# Patient Record
Sex: Female | Born: 1950 | Race: Black or African American | Hispanic: No | Smoking: Never smoker
Health system: Southern US, Community
[De-identification: ages and names within clinical notes are randomized; demographics above are authoritative.]

## PROBLEM LIST (undated history)

## (undated) DIAGNOSIS — IMO0002 Reserved for concepts with insufficient information to code with codable children: Secondary | ICD-10-CM

## (undated) DIAGNOSIS — I499 Cardiac arrhythmia, unspecified: Secondary | ICD-10-CM

---

## 2011-03-07 ENCOUNTER — Emergency Department (HOSPITAL_COMMUNITY): Payer: Self-pay

## 2011-03-07 ENCOUNTER — Emergency Department (HOSPITAL_COMMUNITY)
Admission: EM | Admit: 2011-03-07 | Discharge: 2011-03-07 | Disposition: A | Discharge status: Home / Self Care | Payer: Self-pay | Attending: Emergency Medicine | Admitting: Emergency Medicine

## 2011-03-07 ENCOUNTER — Encounter: Payer: Self-pay | Admitting: Nurse Practitioner

## 2011-03-07 DIAGNOSIS — G479 Sleep disorder, unspecified: Secondary | ICD-10-CM | POA: Insufficient documentation

## 2011-03-07 DIAGNOSIS — R51 Headache: Secondary | ICD-10-CM | POA: Insufficient documentation

## 2011-03-07 HISTORY — DX: Reserved for concepts with insufficient information to code with codable children: IMO0002

## 2011-03-07 HISTORY — DX: Cardiac arrhythmia, unspecified: I49.9

## 2011-03-07 MED ORDER — ZOLPIDEM TARTRATE 5 MG PO TABS: 5.0000 mg | ORAL_TABLET | Freq: Every evening | ORAL | Status: DC | PRN

## 2011-03-07 NOTE — ED Provider Notes (Signed)
History     CSN: [id number] Arrival date & time: 03/07/2011  3:58 PM   First MD Initiated Contact with Patient 03/07/11 1633      Chief Complaint  Patient presents with  . Headache    (Consider location/radiation/quality/duration/timing/severity/associated sxs/prior treatment) Patient is a 60 y.o. female presenting with headaches. The history is provided by the patient and a relative. The history is limited by a language barrier. A language interpreter was used (patient's daughter).  Headache  This is a chronic problem. The current episode started more than 1 week ago. Episode frequency: daily. The problem has been gradually worsening. The headache is associated with nothing (occur at night). The pain is located in the frontal region. The pain is at a severity of 8/10. The patient is experiencing no pain. The pain does not radiate. Pertinent negatives include no fever, no malaise/fatigue, no near-syncope, no palpitations, no shortness of breath, no nausea and no vomiting. She has tried nothing for the symptoms. The treatment provided no relief.    Past Medical History  Diagnosis Date  . Ulcer   . Irregular heartbeat     History reviewed. No pertinent past surgical history.  History reviewed. No pertinent family history.  History  Substance Use Topics  . Smoking status: Never Smoker   . Smokeless tobacco: Not on file  . Alcohol Use: No    OB History    Grav Para Term Preterm Abortions TAB SAB Ect Mult Living                  Review of Systems  Constitutional: Negative for fever, chills and malaise/fatigue.  HENT: Negative for neck pain and neck stiffness.   Eyes: Negative for visual disturbance.  Respiratory: Negative for cough and shortness of breath.   Cardiovascular: Negative for chest pain, palpitations and near-syncope.  Gastrointestinal: Negative for nausea, vomiting and abdominal pain.  Musculoskeletal: Negative for back pain.  Skin: Negative for color change  and rash.  Neurological: Negative for dizziness, speech difficulty, weakness, light-headedness, numbness and headaches.  Psychiatric/Behavioral: Positive for sleep disturbance (decreased sleep). Negative for confusion and decreased concentration.  All other systems reviewed and are negative.    Allergies  Aspirin  Home Medications   Current Outpatient Rx  Name Route Sig Dispense Refill  . ADVIL PM PO Oral Take 1 capsule by mouth at bedtime as needed. For sleep       BP 149/84  Pulse 96  Temp(Src) 98.4 F (36.9 C) (Oral)  Resp 16  Ht 5\' 7"  (1.702 m)  Wt 170 lb (77.111 kg)  BMI 26.63 kg/m2  SpO2 98%  Physical Exam  Nursing note and vitals reviewed. Constitutional: She is oriented to person, place, and time. She appears well-developed and well-nourished.  HENT:  Head: Normocephalic and atraumatic.  Eyes: Pupils are equal, round, and reactive to light.  Neck: Normal range of motion. Neck supple.  Cardiovascular: Normal rate, regular rhythm, normal heart sounds and intact distal pulses.   Pulmonary/Chest: Effort normal and breath sounds normal. No respiratory distress.  Abdominal: Soft. She exhibits no distension. There is no tenderness.  Neurological: She is alert and oriented to person, place, and time. No cranial nerve deficit.       Strength and sensation intact  Skin: Skin is warm and dry.  Psychiatric: She has a normal mood and affect.    ED Course  Procedures (including critical care time)  Labs Reviewed - No data to display Ct Head Wo Contrast  03/07/2011  *RADIOLOGY REPORT*  Clinical Data: Headache.  Worse at night.  CT HEAD WITHOUT CONTRAST  Technique:  Contiguous axial images were obtained from the base of the skull through the vertex without contrast.  Comparison: None.  Findings: Bone windows demonstrate hypoplastic frontal sinuses. Hypoaerated left mastoid air cells, possibly developmental.  Soft tissue windows demonstrate no  mass lesion, hemorrhage,  hydrocephalus, acute infarct, intra-axial, or extra-axial fluid collection.  IMPRESSION: No acute intracranial abnormality.  Original Report Authenticated By: Consuello Bossier, M.D.     1. Headache       MDM  60 yo F presents with intermittent HA at night, bad for the past week or so. States she has a hx of chronic HA, which are typically frontal and R sided, and happen spontaneously; recently arrived from [place] ~1 mo ago for prolonged visit with family, and since then has had gradual worsening of HA. No associated symptoms, present at night, not in morning, no neuro complaints associated with headache. Has never had formal workup for headaches (treated by healer with herbal remedy). Denies other complaints. Exam unremarkable- no signs of neuro findings on exam; pt currently without headache. Due to increasing intensity of headaches, worse at night, and no ability to follow up as outpatient, will get head CT to evaluate for possible underlying structural etiology of headache.  CT head unremarkable, no findings to contribute to headaches. Discussed with pt and family treatment of chronic headaches, as well as sleep hygiene, indications for return, and pt and family express understanding; will prescribe medication to help with sleep as needed.      Theotis Burrow, MD 03/07/11 [phone]

## 2011-03-07 NOTE — ED Notes (Signed)
Patient transported to CT, family remains at the bedside to help with translation should it be needed.

## 2011-03-07 NOTE — ED Notes (Signed)
C/o headache, weakness, not sleeping well or feeling well over past week. Reports yesterday her BP was high also.

## 2011-03-07 NOTE — ED Provider Notes (Signed)
I saw and evaluated the patient, reviewed the resident's note and I agree with the findings and plan.  Headache intermittent, none currently. Having difficulty sleeping, was taking sleeping pills unk type in [place] and none here. Recent weight loss. Neuro exam unremarkable. CT head negative. HOme with supportive care. Ambien for sleep.  Forbes Cellar, MD 03/07/11 2049

## 2011-12-08 ENCOUNTER — Emergency Department (HOSPITAL_COMMUNITY)
Admission: EM | Admit: 2011-12-08 | Discharge: 2011-12-08 | Disposition: A | Discharge status: Home / Self Care | Payer: Self-pay | Attending: Emergency Medicine | Admitting: Emergency Medicine

## 2011-12-08 ENCOUNTER — Encounter (HOSPITAL_COMMUNITY): Payer: Self-pay | Admitting: Emergency Medicine

## 2011-12-08 ENCOUNTER — Emergency Department (HOSPITAL_COMMUNITY): Payer: Self-pay

## 2011-12-08 DIAGNOSIS — H7290 Unspecified perforation of tympanic membrane, unspecified ear: Secondary | ICD-10-CM | POA: Insufficient documentation

## 2011-12-08 DIAGNOSIS — Z888 Allergy status to other drugs, medicaments and biological substances status: Secondary | ICD-10-CM | POA: Insufficient documentation

## 2011-12-08 DIAGNOSIS — H60399 Other infective otitis externa, unspecified ear: Secondary | ICD-10-CM | POA: Insufficient documentation

## 2011-12-08 DIAGNOSIS — H6092 Unspecified otitis externa, left ear: Secondary | ICD-10-CM

## 2011-12-08 DIAGNOSIS — K219 Gastro-esophageal reflux disease without esophagitis: Secondary | ICD-10-CM | POA: Insufficient documentation

## 2011-12-08 LAB — BASIC METABOLIC PANEL
BUN: 11 mg/dL (ref 6–23)
CO2: 25 mEq/L (ref 19–32)
Calcium: 9.6 mg/dL (ref 8.4–10.5)
Chloride: 100 mEq/L (ref 96–112)
Creatinine, Ser: 0.69 mg/dL (ref 0.50–1.10)

## 2011-12-08 LAB — CBC
HCT: 38.1 % (ref 36.0–46.0)
MCH: 30.2 pg (ref 26.0–34.0)
MCV: 89.9 fL (ref 78.0–100.0)
RBC: 4.24 MIL/uL (ref 3.87–5.11)
WBC: 6.1 10*3/uL (ref 4.0–10.5)

## 2011-12-08 MED ORDER — OMEPRAZOLE 20 MG PO CPDR: 20.0000 mg | DELAYED_RELEASE_CAPSULE | Freq: Every day | ORAL | Status: DC

## 2011-12-08 MED ORDER — OFLOXACIN 0.3 % OT SOLN: 10.0000 [drp] | Freq: Every day | OTIC | Status: DC

## 2011-12-08 MED ORDER — OFLOXACIN 0.3 % OT SOLN: 10.0000 [drp] | Freq: Two times a day (BID) | OTIC | Status: AC

## 2011-12-08 MED ORDER — OMEPRAZOLE 20 MG PO CPDR: 20.0000 mg | DELAYED_RELEASE_CAPSULE | Freq: Every day | ORAL | Status: AC

## 2011-12-08 NOTE — ED Notes (Signed)
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

## 2011-12-08 NOTE — ED Notes (Signed)
Pt c/o generalized CP that she sts feels like her ulcers x months with right ear pain with fever at times

## 2011-12-08 NOTE — ED Provider Notes (Signed)
Medical screening examination/treatment/procedure(s) were performed by non-physician practitioner and as supervising physician I was immediately available for consultation/collaboration.   Charles B. Bernette Mayers, MD 12/08/11 2053

## 2011-12-08 NOTE — ED Provider Notes (Signed)
History     CSN: [id number]  Arrival date & time 12/08/11  1118   First MD Initiated Contact with Patient 12/08/11 1647      Chief Complaint  Patient presents with  . Chest Pain  . Otalgia  . Fever    (Consider location/radiation/quality/duration/timing/severity/associated sxs/prior treatment) HPI Comments: Patient presents with chest pain, ear pain, and fever. She reports the chest pain as starting years ago when she was diagnosed with acid reflux. She was well controlled on antacid medications when she was living in [place]. She describes the pain as a burning sensation and does not radiate. The pain is associated with eating and feels just like her acid reflux, according to the patient. She denies SOB, dizziness, diaphoresis.   She also complains of left ear pain that started "years ago." The onset was gradual and she reports associated hearing loss in her left ear. She has not taken anything for the pain. The pain feels like pressure and moderate in severity. She has an associated subjective fever. She denies headache, visual changes, ear discharge or ear trauma.   Patient is a 61 y.o. female presenting with chest pain, ear pain, and fever.  Chest Pain Primary symptoms include a fever. Pertinent negatives for primary symptoms include no fatigue, no shortness of breath, no wheezing, no abdominal pain, no nausea, no vomiting and no dizziness.  Pertinent negatives for associated symptoms include no diaphoresis, no numbness and no weakness.    Otalgia Associated symptoms include hearing loss. Pertinent negatives include no ear discharge, no headaches, no abdominal pain, no diarrhea, no vomiting and no rash.  Fever Primary symptoms of the febrile illness include fever. Primary symptoms do not include fatigue, headaches, wheezing, shortness of breath, abdominal pain, nausea, vomiting, diarrhea or rash.    Past Medical History  Diagnosis Date  . Ulcer   . Irregular heartbeat      History reviewed. No pertinent past surgical history.  History reviewed. No pertinent family history.  History  Substance Use Topics  . Smoking status: Never Smoker   . Smokeless tobacco: Not on file  . Alcohol Use: No    OB History    Grav Para Term Preterm Abortions TAB SAB Ect Mult Living                  Review of Systems  Constitutional: Positive for fever. Negative for chills, diaphoresis and fatigue.  HENT: Positive for hearing loss and ear pain. Negative for ear discharge.   Eyes: Negative for photophobia and visual disturbance.  Respiratory: Negative for shortness of breath and wheezing.   Cardiovascular: Positive for chest pain.  Gastrointestinal: Negative for nausea, vomiting, abdominal pain and diarrhea.  Musculoskeletal: Negative for back pain.  Skin: Negative for rash and wound.  Neurological: Negative for dizziness, syncope, weakness, light-headedness, numbness and headaches.    Allergies  Aspirin  Home Medications   Current Outpatient Rx  Name Route Sig Dispense Refill  . ADULT MULTIVITAMIN W/MINERALS CH Oral Take 1 tablet by mouth daily.    . OFLOXACIN 0.3 % OT SOLN Otic Place 10 drops in ear(s) 2 (two) times daily. 5 mL 0  . OMEPRAZOLE 20 MG PO CPDR Oral Take 1 capsule (20 mg total) by mouth daily. 30 capsule 0    BP 143/86  Pulse 89  Temp 98.6 F (37 C) (Oral)  Resp 18  SpO2 98%  Physical Exam  Nursing note and vitals reviewed. Constitutional: She is oriented to person, place, and time.  She appears well-developed and well-nourished. No distress.  HENT:  Head: Normocephalic and atraumatic.  Right Ear: External ear normal.  Left Ear: External ear normal.  Mouth/Throat: Oropharynx is clear and moist. No oropharyngeal exudate.       Left external canal appears erythematous and with small amount of purulent discharge that is crusted. Left TM reveals small perforation in the center. Right TM intact and external canal clear and without  erythema or purulence.   Eyes: Conjunctivae are normal. No scleral icterus.  Neck: Normal range of motion. Neck supple.  Cardiovascular: Normal rate and regular rhythm.  Exam reveals no gallop and no friction rub.   Murmur heard. Pulmonary/Chest: She has no wheezes. She has no rales. She exhibits no tenderness.  Abdominal: Soft. There is no tenderness.  Musculoskeletal: Normal range of motion.  Neurological: She is alert and oriented to person, place, and time.  Skin: Skin is warm and dry. She is not diaphoretic.  Psychiatric: She has a normal mood and affect. Her behavior is normal.    ED Course  Procedures (including critical care time)  Labs Reviewed  BASIC METABOLIC PANEL - Abnormal; Notable for the following:    Glucose, Bld 237 (*)     All other components within normal limits  CBC  POCT I-STAT TROPONIN I   Dg Chest 2 View  12/08/2011  *RADIOLOGY REPORT*  Clinical Data: Chest pain.  Fever.  CHEST - 2 VIEW  Comparison: No priors.  Findings: Lung volumes are normal.  There is an ill-defined interstitial prominence throughout the mid and lower lungs bilaterally.  No focal consolidative airspace disease.  No pleural effusions.  Pulmonary vasculature is normal.  Heart size is upper limits of normal.  Mediastinal contours are unremarkable.  IMPRESSION: 1.  Ill-defined interstitial prominence throughout the mid and lower lungs bilaterally.  This is nonspecific, and could be chronic (no priors are available for comparison).  However, in the setting of acute chest pain and fever, findings may simply reflect a bronchitis.  The comparison with any prior outside chest x-rays (if available) would be of use to determine the chronicity of these findings.   Original Report Authenticated By: Florencia Reasons, M.D.      1. Acid reflux   2. Otitis externa of left ear   3. Tympanic membrane perforation       MDM  5:22 PM Patient's symptoms most likely due to otitis externa with tympanic  membrane perforation. Also she report chest pain that is unchanged and feels just like her acid reflux that she has experienced for years. Her EKG is unremarkable and she has a negative troponin, making a cardiac etiology unlikely. Patient will be discharged with a recommended ENT follow up with Dr. Annalee Genta, prilosec, and ofloxacin otic drops. No further evaluation at this time. I have attached a list of low cost medical providers to be used as follow up.         Emilia Beck, [place] 12/08/11 1741

## 2011-12-11 ENCOUNTER — Other Ambulatory Visit: Payer: Self-pay | Admitting: Otolaryngology

## 2011-12-11 DIAGNOSIS — H905 Unspecified sensorineural hearing loss: Secondary | ICD-10-CM

## 2011-12-11 DIAGNOSIS — H9209 Otalgia, unspecified ear: Secondary | ICD-10-CM

## 2011-12-11 DIAGNOSIS — H903 Sensorineural hearing loss, bilateral: Secondary | ICD-10-CM

## 2011-12-11 DIAGNOSIS — H9190 Unspecified hearing loss, unspecified ear: Secondary | ICD-10-CM

## 2015-07-18 ENCOUNTER — Emergency Department (HOSPITAL_COMMUNITY): Payer: Self-pay

## 2015-07-18 ENCOUNTER — Emergency Department (HOSPITAL_COMMUNITY)
Admission: EM | Admit: 2015-07-18 | Discharge: 2015-07-18 | Disposition: A | Discharge status: Home / Self Care | Payer: Self-pay | Attending: Emergency Medicine | Admitting: Emergency Medicine

## 2015-07-18 ENCOUNTER — Encounter (HOSPITAL_COMMUNITY): Payer: Self-pay | Admitting: *Deleted

## 2015-07-18 DIAGNOSIS — R000 Tachycardia, unspecified: Secondary | ICD-10-CM | POA: Insufficient documentation

## 2015-07-18 DIAGNOSIS — H9201 Otalgia, right ear: Secondary | ICD-10-CM | POA: Insufficient documentation

## 2015-07-18 DIAGNOSIS — J189 Pneumonia, unspecified organism: Secondary | ICD-10-CM

## 2015-07-18 DIAGNOSIS — Z8679 Personal history of other diseases of the circulatory system: Secondary | ICD-10-CM | POA: Insufficient documentation

## 2015-07-18 DIAGNOSIS — Z79899 Other long term (current) drug therapy: Secondary | ICD-10-CM | POA: Insufficient documentation

## 2015-07-18 DIAGNOSIS — J159 Unspecified bacterial pneumonia: Secondary | ICD-10-CM | POA: Insufficient documentation

## 2015-07-18 DIAGNOSIS — D649 Anemia, unspecified: Secondary | ICD-10-CM | POA: Insufficient documentation

## 2015-07-18 LAB — BASIC METABOLIC PANEL
ANION GAP: 10 (ref 5–15)
BUN: 7 mg/dL (ref 6–20)
CALCIUM: 9.2 mg/dL (ref 8.9–10.3)
CHLORIDE: 101 mmol/L (ref 101–111)
CO2: 25 mmol/L (ref 22–32)
CREATININE: 0.83 mg/dL (ref 0.44–1.00)
GFR calc non Af Amer: >60 mL/min (ref >60)
Glucose, Bld: 133 mg/dL — ABNORMAL HIGH (ref 65–99)
POTASSIUM: 3.7 mmol/L (ref 3.5–5.1)
SODIUM: 136 mmol/L (ref 135–145)

## 2015-07-18 LAB — CBC WITH DIFFERENTIAL/PLATELET
BASOS ABS: 0 10*3/uL (ref 0.0–0.1)
BASOS PCT: 0 %
EOS ABS: 0 10*3/uL (ref 0.0–0.7)
Eosinophils Relative: 0 %
HCT: 32.9 % — ABNORMAL LOW (ref 36.0–46.0)
HEMOGLOBIN: 11.1 g/dL — AB (ref 12.0–15.0)
LYMPHS ABS: 2.6 10*3/uL (ref 0.7–4.0)
Lymphocytes Relative: 18 %
MCH: 31.1 pg (ref 26.0–34.0)
MCHC: 33.7 g/dL (ref 30.0–36.0)
MCV: 92.2 fL (ref 78.0–100.0)
Monocytes Absolute: 1 10*3/uL (ref 0.1–1.0)
Monocytes Relative: 7 %
NEUTROS PCT: 75 %
Neutro Abs: 10.6 10*3/uL — ABNORMAL HIGH (ref 1.7–7.7)
PLATELETS: 374 10*3/uL (ref 150–400)
RBC: 3.57 MIL/uL — AB (ref 3.87–5.11)
RDW: 12.8 % (ref 11.5–15.5)
WBC: 14.3 10*3/uL — AB (ref 4.0–10.5)

## 2015-07-18 LAB — I-STAT TROPONIN, ED: TROPONIN I, POC: 0 ng/mL (ref 0.00–0.08)

## 2015-07-18 MED ORDER — KETOROLAC TROMETHAMINE 30 MG/ML IJ SOLN
30.0000 mg | Freq: Once | INTRAMUSCULAR | Status: AC
  Administered 2015-07-18: 30 mg via INTRAVENOUS
  Filled 2015-07-18: qty 1

## 2015-07-18 MED ORDER — AZITHROMYCIN 250 MG PO TABS
500.0000 mg | ORAL_TABLET | Freq: Once | ORAL | Status: AC
  Administered 2015-07-18: 500 mg via ORAL
  Filled 2015-07-18: qty 2

## 2015-07-18 MED ORDER — SODIUM CHLORIDE 0.9 % IV BOLUS (SEPSIS)
1000.0000 mL | Freq: Once | INTRAVENOUS | Status: AC
  Administered 2015-07-18: 1000 mL via INTRAVENOUS

## 2015-07-18 MED ORDER — AZITHROMYCIN 250 MG PO TABS: 250.0000 mg | ORAL_TABLET | Freq: Every day | ORAL | Status: AC

## 2015-07-18 MED ORDER — DIPHENHYDRAMINE HCL 50 MG/ML IJ SOLN
25.0000 mg | Freq: Once | INTRAMUSCULAR | Status: AC
  Administered 2015-07-18: 25 mg via INTRAVENOUS
  Filled 2015-07-18: qty 1

## 2015-07-18 NOTE — ED Provider Notes (Signed)
CSN: [id number]     Arrival date & time 07/18/15  6213 History   First MD Initiated Contact with Patient 07/18/15 1008     Chief Complaint  Patient presents with  . Cough  . Otalgia   (Consider location/radiation/quality/duration/timing/severity/associated sxs/prior Treatment) Patient is a 65 y.o. female presenting with cough and ear pain. The history is provided by the patient and a relative. No language interpreter was used (Son translated).  Cough Associated symptoms: chills and ear pain   Associated symptoms: no fever and no shortness of breath   Otalgia Associated symptoms: cough   Associated symptoms: no fever     Monica Chaney is a 65 y.o female with a history of irregular heartbeat who presents with right ear pain and productive white sputum cough, headache, and chills at night. Also complaining of chest pain with cough and nasal congestion. Her grandkids have recently been sick with similar symptoms. She denies any treatment prior to arrival. She denies any history of smoking or asthma. Her son reports she has had ear pain for several years now. This is not new for her. She denies any weight loss, night sweats, shortness of breath, abdominal pain, nausea, vomiting, diarrhea.   Past Medical History  Diagnosis Date  . Ulcer   . Irregular heartbeat    History reviewed. No pertinent past surgical history. No family history on file. Social History  Substance Use Topics  . Smoking status: Never Smoker   . Smokeless tobacco: None  . Alcohol Use: No   OB History    No data available     Review of Systems  Constitutional: Positive for chills. Negative for fever.  HENT: Positive for ear pain.   Respiratory: Positive for cough. Negative for shortness of breath.   All other systems reviewed and are negative.     Allergies  Aspirin  Home Medications   Prior to Admission medications   Medication Sig Start Date End Date Taking? Authorizing Provider  acetaminophen  (TYLENOL) 325 MG tablet Take 650 mg by mouth every 6 (six) hours as needed for mild pain.   Yes Historical Provider, MD  Phenylephrine-Pheniramine-DM [facility] COLD & COUGH) 01-31-19 MG PACK Take 1 packet by mouth every 8 (eight) hours as needed (cold symptoms).   Yes Historical Provider, MD  azithromycin (ZITHROMAX) 250 MG tablet Take 1 tablet (250 mg total) by mouth daily. Take 1 every day beginning on 07/19/2015 until finished. You received her first dose in the ER today. 07/18/15   Brylin Stopper Patel-Mills, PA-C  omeprazole (PRILOSEC) 20 MG capsule Take 1 capsule (20 mg total) by mouth daily. 12/08/11 12/07/12  Kaitlyn Szekalski, PA-C   BP 139/81 mmHg  Pulse 89  Temp(Src) 99.5 F (37.5 C) (Oral)  Resp 20  Ht 5' 5.5" (1.664 m)  Wt 75.07 kg  BMI 27.11 kg/m2  SpO2 97% Physical Exam  Constitutional: She is oriented to person, place, and time. She appears well-developed and well-nourished. No distress.  HENT:  Head: Normocephalic and atraumatic.  Bilateral TMs are without erythema or bulging. No air-fluid level. Ear canals appear normal also.  Eyes: Conjunctivae are normal.  Neck: Normal range of motion. Neck supple.  Cardiovascular: Normal rate, regular rhythm and normal heart sounds.   Regular rate and rhythm. No murmur. Distal pulses intact.   Pulmonary/Chest: Effort normal. No accessory muscle usage. No respiratory distress. She has no wheezes. She has rhonchi in the left lower field.  No respiratory distress. Able to speak full sentences. No accessory muscle  usage. 93% oxygen on room air at bedside.  Abdominal: Soft. There is no tenderness.  Abdomen is soft.  Musculoskeletal: Normal range of motion.  Neurological: She is alert and oriented to person, place, and time.  Skin: Skin is warm and dry. She is not diaphoretic.  Nursing note and vitals reviewed.   ED Course  Procedures (including critical care time) Labs Review Labs Reviewed  CBC WITH DIFFERENTIAL/PLATELET - Abnormal; Notable  for the following:    WBC 14.3 (*)    RBC 3.57 (*)    Hemoglobin 11.1 (*)    HCT 32.9 (*)    Neutro Abs 10.6 (*)    All other components within normal limits  BASIC METABOLIC PANEL - Abnormal; Notable for the following:    Glucose, Bld 133 (*)    All other components within normal limits  I-STAT TROPOININ, ED    Imaging Review Dg Chest 2 View  07/18/2015  CLINICAL DATA:  Productive cough for 2 weeks EXAM: CHEST  2 VIEW COMPARISON:  12/08/2011 FINDINGS: Enlargement of cardiac silhouette. Mediastinal contours and pulmonary vascularity normal. Minimal atherosclerotic calcification aorta. Bronchitic changes with scattered diffuse interstitial prominence little changed since previous exam when accounting for differences in technique. Increased bibasilar atelectasis versus infiltrate. No definite acute infiltrate, pleural effusion or pneumothorax. Bones unremarkable. IMPRESSION: Chronic bronchitic interstitial changes with increased bibasilar atelectasis versus infiltrate. Electronically Signed   By: Ulyses SouthwardMark  Boles M.D.   On: 07/18/2015 11:21   I have personally reviewed and evaluated these images and lab results as part of my medical decision-making.   EKG Interpretation None      MDM   Final diagnoses:  Anemia, unspecified anemia type  Community acquired pneumonia   Patient presents for URI like symptoms. She is 93% oxygen on room air. No significant past medical history. She is also tachycardic but afebrile. She had white count of 14 and hemoglobin of 11.1. Patient denies any chest pain. Chest x-ray shows chronic bronchitic changes with possible atelectasis versus infiltrate. I will treat the patient with antibiotics. She is not febrile or hypoxic. I believe she can go home with oral antibiotics and follow-up with her doctor. Return precautions were thoroughly discussed with her and her son. They agree with plan.  Medications  sodium chloride 0.9 % bolus 1,000 mL (0 mLs Intravenous Stopped  07/18/15 1313)  ketorolac (TORADOL) 30 MG/ML injection 30 mg (30 mg Intravenous Given 07/18/15 1229)  diphenhydrAMINE (BENADRYL) injection 25 mg (25 mg Intravenous Given 07/18/15 1229)  azithromycin (ZITHROMAX) tablet 500 mg (500 mg Oral Given 07/18/15 1229)   Filed Vitals:   07/18/15 1300 07/18/15 1301  BP: 139/81 139/81  Pulse:  89  Temp:  99.5 F (37.5 C)  Resp:  [address]20     Monica Boback Patel-Mills, PA-C 07/18/15 1541  Vanetta MuldersScott Zackowski, MD 07/21/15 0945

## 2015-07-18 NOTE — Discharge Instructions (Signed)
Community-Acquired Pneumonia, Adult Follow-up with your doctor within 48 hours. Return for worsening symptoms such as shortness of breath, chest pain, or no improvement in 48 hours. Pneumonia is an infection of the lungs. One type of pneumonia can happen while a person is in a hospital. A different type can happen when a person is not in a hospital (community-acquired pneumonia). It is easy for this kind to spread from person to person. It can spread to you if you breathe near an infected person who coughs or sneezes. Some symptoms include:  A dry cough.  A wet (productive) cough.  Fever.  Sweating.  Chest pain. HOME CARE  Take over-the-counter and prescription medicines only as told by your doctor.  Only take cough medicine if you are losing sleep.  If you were prescribed an antibiotic medicine, take it as told by your doctor. Do not stop taking the antibiotic even if you start to feel better.  Sleep with your head and neck raised (elevated). You can do this by putting a few pillows under your head, or you can sleep in a recliner.  Do not use tobacco products. These include cigarettes, chewing tobacco, and e-cigarettes. If you need help quitting, ask your doctor.  Drink enough water to keep your pee (urine) clear or pale yellow. A shot (vaccine) can help prevent pneumonia. Shots are often suggested for:  People older than 67 years of age.  People older than 67 years of age:  Who are having cancer treatment.  Who have long-term (chronic) lung disease.  Who have problems with their body's defense system (immune system). You may also prevent pneumonia if you take these actions:  Get the flu (influenza) shot every year.  Go to the dentist as often as told.  Wash your hands often. If soap and water are not available, use hand sanitizer. GET HELP IF:  You have a fever.  You lose sleep because your cough medicine does not help. GET HELP RIGHT AWAY IF:  You are short of  breath and it gets worse.  You have more chest pain.  Your sickness gets worse. This is very serious if:  You are an older adult.  Your body's defense system is weak.  You cough up blood.   This information is not intended to replace advice given to you by your health care provider. Make sure you discuss any questions you have with your health care provider.   Document Released: 09/16/2007 Document Revised: 12/19/2014 Document Reviewed: 07/25/2014 Elsevier Interactive Patient Education 2016 [employer]. Anemia, Nonspecific Anemia is a condition in which the concentration of red blood cells or hemoglobin in the blood is below normal. Hemoglobin is a substance in red blood cells that carries oxygen to the tissues of the body. Anemia results in not enough oxygen reaching these tissues.  CAUSES  Common causes of anemia include:   Excessive bleeding. Bleeding may be internal or external. This includes excessive bleeding from periods (in women) or from the intestine.   Poor nutrition.   Chronic kidney, thyroid, and liver disease.  Bone marrow disorders that decrease red blood cell production.  Cancer and treatments for cancer.  HIV, AIDS, and their treatments.  Spleen problems that increase red blood cell destruction.  Blood disorders.  Excess destruction of red blood cells due to infection, medicines, and autoimmune disorders. SIGNS AND SYMPTOMS   Minor weakness.   Dizziness.   Headache.  Palpitations.   Shortness of breath, especially with exercise.   Paleness.  Cold sensitivity.  Indigestion.  Nausea.  Difficulty sleeping.  Difficulty concentrating. Symptoms may occur suddenly or they may develop slowly.  DIAGNOSIS  Additional blood tests are often needed. These help your health care provider determine the best treatment. Your health care provider will check your stool for blood and look for other causes of blood loss.  TREATMENT  Treatment  varies depending on the cause of the anemia. Treatment can include:   Supplements of iron, vitamin B12, or folic acid.   Hormone medicines.   A blood transfusion. This may be needed if blood loss is severe.   Hospitalization. This may be needed if there is significant continual blood loss.   Dietary changes.  Spleen removal. HOME CARE INSTRUCTIONS Keep all follow-up appointments. It often takes many weeks to correct anemia, and having your health care provider check on your condition and your response to treatment is very important. SEEK IMMEDIATE MEDICAL CARE IF:   You develop extreme weakness, shortness of breath, or chest pain.   You become dizzy or have trouble concentrating.  You develop heavy vaginal bleeding.   You develop a rash.   You have bloody or black, tarry stools.   You faint.   You vomit up blood.   You vomit repeatedly.   You have abdominal pain.  You have a fever or persistent symptoms for more than 2-3 days.   You have a fever and your symptoms suddenly get worse.   You are dehydrated.  MAKE SURE YOU:  Understand these instructions.  Will watch your condition.  Will get help right away if you are not doing well or get worse.   This information is not intended to replace advice given to you by your health care provider. Make sure you discuss any questions you have with your health care provider.   Document Released: 05/07/2004 Document Revised: 11/30/2012 Document Reviewed: 09/23/2012 Elsevier Interactive Patient Education [employer]2016 [employer]

## 2015-07-18 NOTE — ED Notes (Signed)
Pt c/o R ear pain, productive cough with white sputum, pt reports decline in appetite, pt denies n/v/d, A&O x4

## 2018-01-19 IMAGING — DX DG CHEST 2V
2 series · 2 of 2 positions shown · non-contrast
Comparison: 12/08/2011

CLINICAL DATA: Productive cough for 2 weeks

EXAM:
CHEST  2 VIEW

[chest pa]
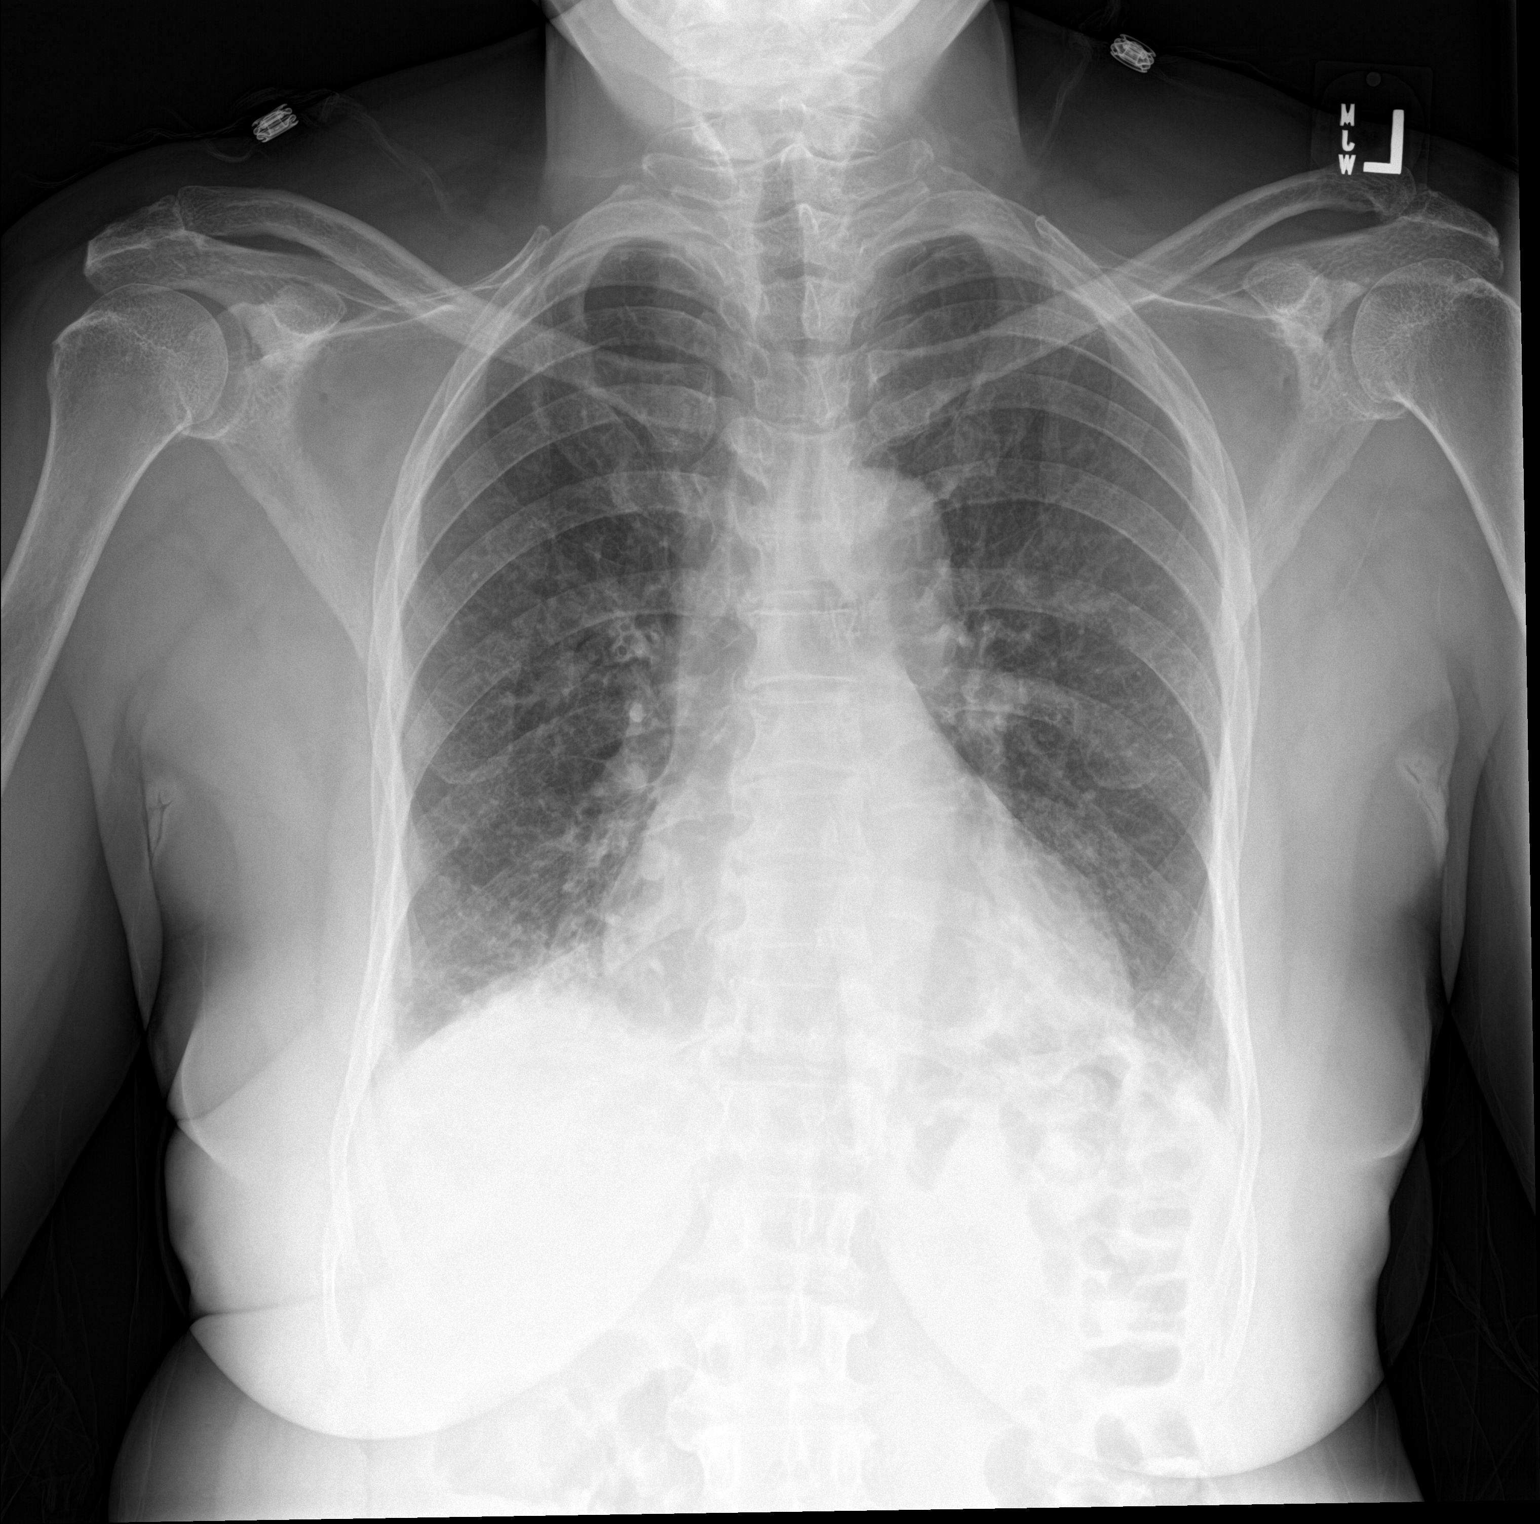

[chest lat]
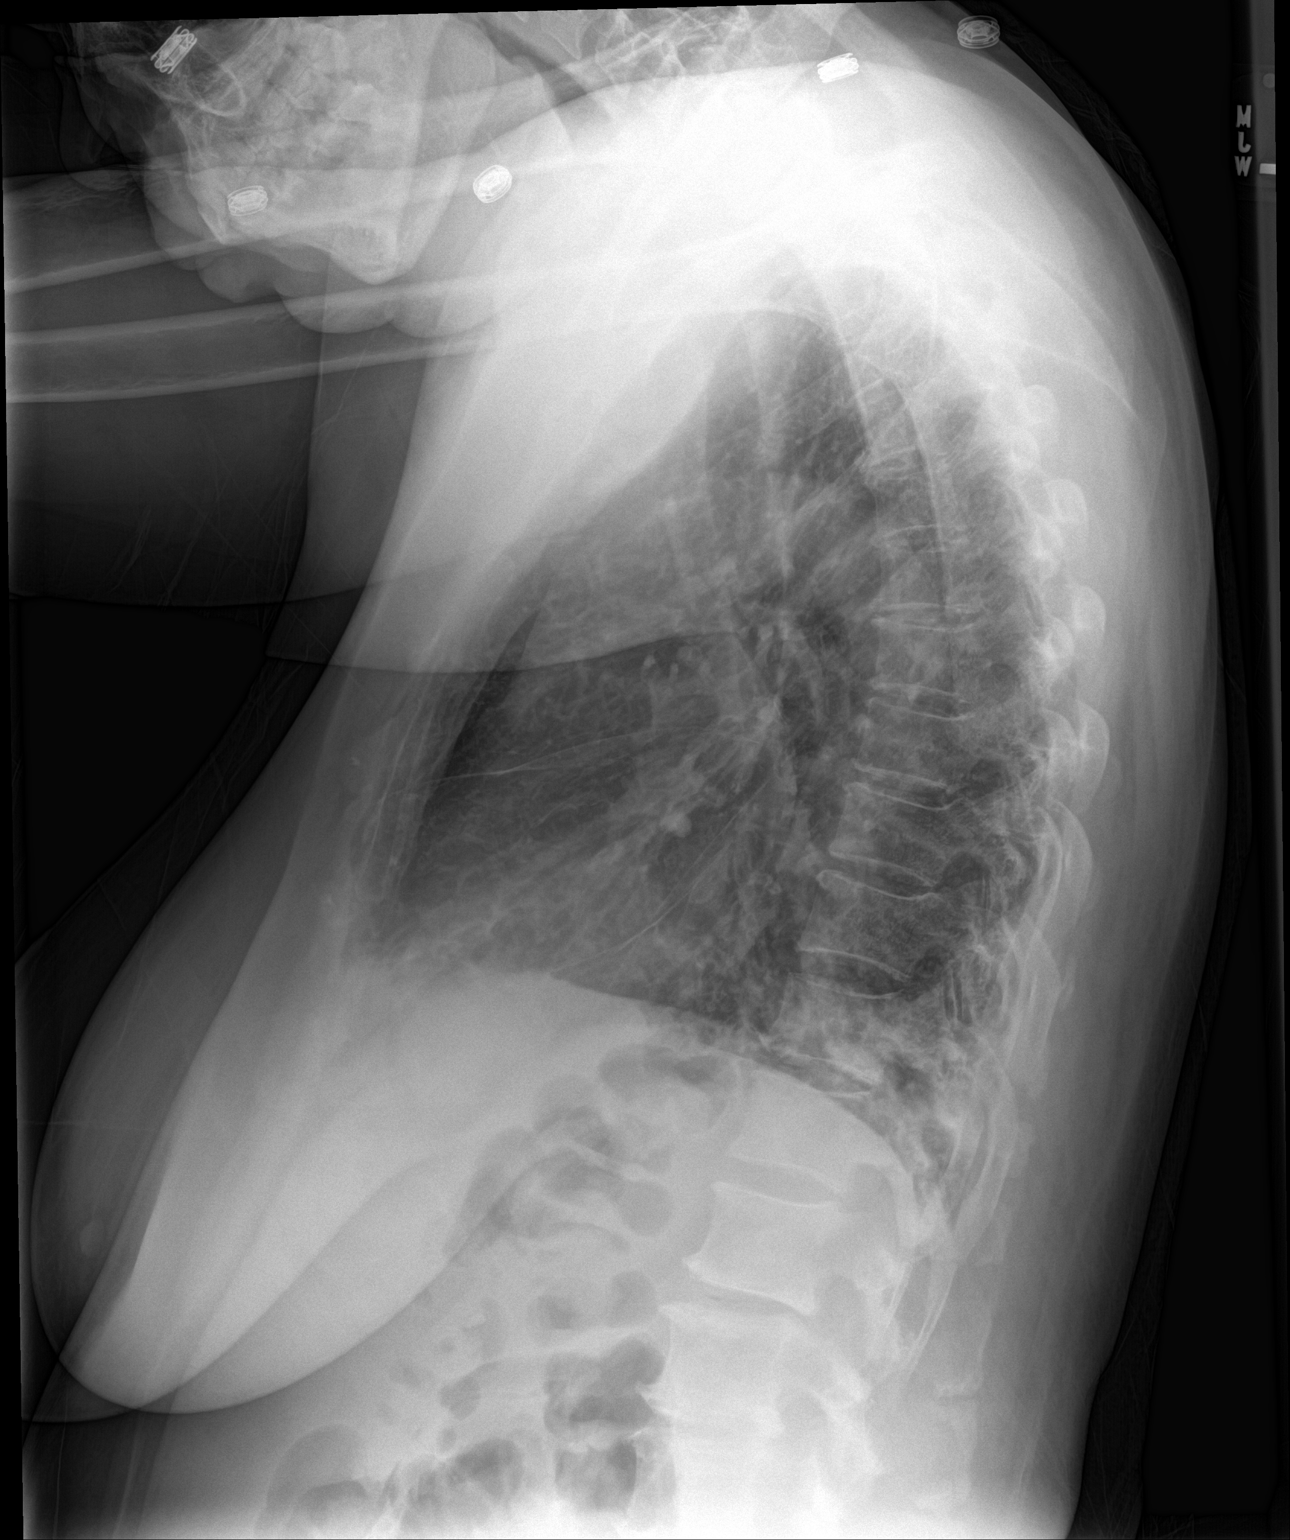

[2 of 2 positions shown; findings below may reference images not displayed]

FINDINGS: Enlargement of cardiac silhouette.

Mediastinal contours and pulmonary vascularity normal.

Minimal atherosclerotic calcification aorta.

Bronchitic changes with scattered diffuse interstitial prominence
little changed since previous exam when accounting for differences
in technique.

Increased bibasilar atelectasis versus infiltrate.

No definite acute infiltrate, pleural effusion or pneumothorax.

Bones unremarkable.
IMPRESSION: Chronic bronchitic interstitial changes with increased bibasilar
atelectasis versus infiltrate.
